# Patient Record
Sex: Female | Born: 1980 | Race: White | Hispanic: No | Marital: Single | Smoking: Current every day smoker
Health system: Southern US, Community
[De-identification: ages and names within clinical notes are randomized; demographics above are authoritative.]

---

## 2016-03-16 ENCOUNTER — Encounter (HOSPITAL_COMMUNITY): Payer: Self-pay | Admitting: Emergency Medicine

## 2016-03-16 ENCOUNTER — Emergency Department (HOSPITAL_COMMUNITY)
Admission: EM | Admit: 2016-03-16 | Discharge: 2016-03-16 | Disposition: A | Discharge status: Home / Self Care | Payer: Medicaid Other | Attending: Emergency Medicine | Admitting: Emergency Medicine

## 2016-03-16 DIAGNOSIS — F17200 Nicotine dependence, unspecified, uncomplicated: Secondary | ICD-10-CM | POA: Insufficient documentation

## 2016-03-16 DIAGNOSIS — R0981 Nasal congestion: Secondary | ICD-10-CM | POA: Diagnosis present

## 2016-03-16 MED ORDER — AZITHROMYCIN 250 MG PO TABS
500.0000 mg | ORAL_TABLET | Freq: Once | ORAL | Status: AC
  Administered 2016-03-16: 500 mg via ORAL
  Filled 2016-03-16: qty 2

## 2016-03-16 MED ORDER — TRIAMCINOLONE ACETONIDE 55 MCG/ACT NA AERO: 2.0000 | INHALATION_SPRAY | Freq: Every day | NASAL | 12 refills | Status: AC

## 2016-03-16 MED ORDER — AZITHROMYCIN 250 MG PO TABS: 250.0000 mg | ORAL_TABLET | Freq: Every day | ORAL | 0 refills | Status: AC

## 2016-03-16 NOTE — Discharge Instructions (Signed)
Stay well-hydrated. Use nasal spray as instructed. Take entire course of antibiotic even if you feel better.

## 2016-03-16 NOTE — ED Notes (Signed)
Patient states she has had head cold that started Monday night, sinus congestion and h/a. yest patient states had fevers. Now c/o green drainage from nose bothering her but denies cough or any resp. symptoms.

## 2016-03-16 NOTE — ED Triage Notes (Signed)
Pt sts URI sx with nasal congestion x 3 days

## 2016-03-16 NOTE — ED Provider Notes (Signed)
[facility]-EMERGENCY DEPT Provider Note   CSN: [id number] Arrival date & time: 03/16/16  1604  By signing my name below, I, Teofilo Pod, attest that this documentation has been prepared under the direction and in the presence of Mathews Robinsons, [place]. Electronically Signed: Teofilo Pod, ED Scribe. 03/16/2016. 5:38 PM.    History   Chief Complaint Chief Complaint  Patient presents with  . URI    The history is provided by the patient. No language interpreter was used.   HPI Comments:  Brooke Becker is a 36 y.o. female who presents to the Emergency Department complaining of constant nasal congestion x 3 days. Pt complains of associated rhinorrhea, general weakness, fever, fatigue, sinus pain, cough, and generalized body aches that have since improved. She states the her rhinorrhea has been green, and states that the mucus is causing her nose to feel iritated. Pt reports that she had similar symptoms 3 months ago with vomiting. Pt took OTC flu medication, mucinex, and motrin, with no relief. Pt denies sore throat  History reviewed. No pertinent past medical history.  There are no active problems to display for this patient.   History reviewed. No pertinent surgical history.  OB History    No data available       Home Medications    Prior to Admission medications   Medication Sig Start Date End Date Taking? Authorizing Provider  azithromycin (ZITHROMAX Z-PAK) 250 MG tablet Take 1 tablet (250 mg total) by mouth daily. 03/16/16 03/20/16  Georgiana Shore, PA-C  triamcinolone (NASACORT AQ) 55 MCG/ACT AERO nasal inhaler Place 2 sprays into the nose daily. 03/16/16   Georgiana Shore, PA-C    Family History History reviewed. No pertinent family history.  Social History Social History  Substance Use Topics  . Smoking status: Current Every Day Smoker  . Smokeless tobacco: Never Used  . Alcohol use Yes     Allergies   Patient has no known allergies.   Review of  Systems Review of Systems  Constitutional: Positive for fatigue and fever. Negative for chills.       Subjective fevers   HENT: Positive for congestion and sinus pain. Negative for ear pain and sore throat.   Eyes: Negative for pain and visual disturbance.  Respiratory: Positive for cough. Negative for chest tightness, shortness of breath, wheezing and stridor.   Cardiovascular: Negative for chest pain and palpitations.  Gastrointestinal: Negative for abdominal distention, abdominal pain, blood in stool, nausea and vomiting.  Genitourinary: Negative for difficulty urinating, dysuria, flank pain and hematuria.  Musculoskeletal: Positive for myalgias. Negative for arthralgias, back pain, gait problem, joint swelling, neck pain and neck stiffness.  Skin: Negative for color change, pallor and rash.  Neurological: Negative for dizziness, seizures, syncope and weakness.     Physical Exam Updated Vital Signs BP 132/64 (BP Location: Right Arm)   Pulse 105   Temp 98.2 F (36.8 C) (Oral)   Resp 18   SpO2 100%   Physical Exam  Constitutional: She appears well-developed and well-nourished. No distress.  Patient is afebrile, non-toxic appearing, seating comfortably in chair in no acute distress.   HENT:  Head: Normocephalic and atraumatic.  Mouth/Throat: Oropharynx is clear and moist. No oropharyngeal exudate.  Boggy turbinates on the left side. Frontal sinus tenderness.  Eyes: Conjunctivae and EOM are normal. Pupils are equal, round, and reactive to light. Right eye exhibits no discharge. Left eye exhibits no discharge.  Neck: Normal range of motion. Neck supple.  Cardiovascular:  Normal rate, regular rhythm and normal heart sounds.   Pulmonary/Chest: Effort normal and breath sounds normal. No respiratory distress. She has no wheezes. She has no rales. She exhibits no tenderness.  Abdominal: She exhibits no distension.  Musculoskeletal: She exhibits no edema.  Neurological: She is alert.    Skin: Skin is warm and dry. No rash noted. She is not diaphoretic. No erythema. No pallor.  Psychiatric: She has a normal mood and affect. Her behavior is normal.  Nursing note and vitals reviewed.    ED Treatments / Results  DIAGNOSTIC STUDIES:  Oxygen Saturation is 100% on RA, normal by my interpretation.    COORDINATION OF CARE:  5:32 PM Discussed treatment plan with pt at bedside and pt agreed to plan.    Labs (all labs ordered are listed, but only abnormal results are displayed) Labs Reviewed - No data to display  EKG  EKG Interpretation None       Radiology No results found.  Procedures Procedures (including critical care time)  Medications Ordered in ED Medications  azithromycin (ZITHROMAX) tablet 500 mg (500 mg Oral Given 03/16/16 1800)     Initial Impression / Assessment and Plan / ED Course   I have reviewed the triage vital signs and the nursing notes.  Pertinent labs & imaging results that were available during my care of the patient were reviewed by me and considered in my medical decision making (see chart for details).    Pt symptoms consistent with URI. Pt will be discharged with symptomatic treatment.  Discussed return precautions.  Pt is hemodynamically stable & in NAD prior to discharge.  Some frontal sinus tenderness to percussion, swollen turbinate. Exam otherwise unremarkable. Patient is afebrile and non-toxic appearing without use of antipyretics today.  Patient was discussed with Dr. Jeraldine LootsLockwood who recommends z-pak and agrees with assessment and plan.  Discussed strict return precautions. Patient was advised to return to the emergency department if experiencing any new or worsening symptoms. Patient clearly understood instructions and agreed with discharge plan.  Final Clinical Impressions(s) / ED Diagnoses   Final diagnoses:  Nasal sinus congestion    New Prescriptions Discharge Medication List as of 03/16/2016  5:53 PM    START  taking these medications   Details  azithromycin (ZITHROMAX Z-PAK) 250 MG tablet Take 1 tablet (250 mg total) by mouth daily., Starting Wed 03/16/2016, Until Sun 03/20/2016, Print    triamcinolone (NASACORT AQ) 55 MCG/ACT AERO nasal inhaler Place 2 sprays into the nose daily., Starting Wed 03/16/2016, Print      I personally performed the services described in this documentation, which was scribed in my presence. The recorded information has been reviewed and is accurate.     Georgiana ShoreJessica B Zanaria Morell, PA-C 03/16/16 1849    Gerhard Munchobert Lockwood, MD 03/16/16 2041

## 2016-10-25 ENCOUNTER — Encounter: Payer: Self-pay | Admitting: Obstetrics & Gynecology

## 2016-11-11 ENCOUNTER — Other Ambulatory Visit (HOSPITAL_COMMUNITY)
Admission: RE | Admit: 2016-11-11 | Discharge: 2016-11-11 | Disposition: A | Discharge status: Home / Self Care | Payer: Medicaid Other | Source: Ambulatory Visit | Attending: Obstetrics & Gynecology | Admitting: Obstetrics & Gynecology

## 2016-11-11 ENCOUNTER — Ambulatory Visit (INDEPENDENT_AMBULATORY_CARE_PROVIDER_SITE_OTHER): Payer: Medicaid Other | Admitting: Obstetrics & Gynecology

## 2016-11-11 ENCOUNTER — Other Ambulatory Visit: Payer: Self-pay | Admitting: Obstetrics & Gynecology

## 2016-11-11 VITALS — BP 114/72 | HR 72 | Wt 235.0 lb

## 2016-11-11 DIAGNOSIS — Z01419 Encounter for gynecological examination (general) (routine) without abnormal findings: Secondary | ICD-10-CM

## 2016-11-11 DIAGNOSIS — Z0000 Encounter for general adult medical examination without abnormal findings: Secondary | ICD-10-CM

## 2016-11-11 NOTE — Progress Notes (Signed)
Subjective:    Brooke Becker is a 36 y.o. separated (for 13 years) White female P3 (5916, 10615, and 38 yo sons)  who presents for an annual exam. She has LLB pain for about 3 months, worried that this could be her only remaining ovary. The patient is sexually active. GYN screening history: last pap: was normal. The patient wears seatbelts: yes. The patient participates in regular exercise: yes. Has the patient ever been transfused or tattooed?: yes. The patient reports that there is not domestic violence in her life.   Menstrual History: OB History    No data available      Menarche age: [age] No LMP recorded.    The following portions of the patient's history were reviewed and updated as appropriate: allergies, current medications, past family history, past medical history, past social history, past surgical history and problem list.  Review of Systems Pertinent items are noted in HPI.   FH- + breast cancer- pGM at age 36, p first cousin at 721 yo + ovarian cancer- maunt, mfirst cousin ? Colon cancer Monogamous for 5 years with a fellow, denies dyspareunia, had a BTL Works as a Secondary school teacherhome hospice aide Had a flu vaccine this year Had a mammogram 2012   Objective:    BP 114/72   Pulse 72   Wt 235 lb (106.6 kg)   General Appearance:    Alert, cooperative, no distress, appears stated age  Head:    Normocephalic, without obvious abnormality, atraumatic  Eyes:    PERRL, conjunctiva/corneas clear, EOM's intact, fundi    benign, both eyes  Ears:    Normal TM's and external ear canals, both ears  Nose:   Nares normal, septum midline, mucosa normal, no drainage    or sinus tenderness  Throat:   Lips, mucosa, and tongue normal; teeth and gums normal  Neck:   Supple, symmetrical, trachea midline, no adenopathy;    thyroid:  no enlargement/tenderness/nodules; no carotid   bruit or JVD  Back:     Symmetric, no curvature, ROM normal, no CVA tenderness  Lungs:     Clear to auscultation bilaterally,  respirations unlabored  Chest Wall:    No tenderness or deformity   Heart:    Regular rate and rhythm, S1 and S2 normal, no murmur, rub   or gallop  Breast Exam:    No tenderness, masses, or nipple abnormality  Abdomen:     Soft, non-tender, bowel sounds active all four quadrants,    no masses, no organomegaly  Genitalia:    Normal female without lesion, discharge or tenderness, NSSA, NT, mobile, normal adnexal exam     Extremities:   Extremities normal, atraumatic, no cyanosis or edema  Pulses:   2+ and symmetric all extremities  Skin:   Skin color, texture, turgor normal, no rashes or lesions  Lymph nodes:   Cervical, supraclavicular, and axillary nodes normal  Neurologic:   CNII-XII intact, normal strength, sensation and reflexes    throughout  .    Assessment:    Healthy female exam.    Plan:     Thin prep Pap smear. with cotesting My Risk testing Rec mammogram Gyn u/s

## 2016-11-11 NOTE — Progress Notes (Signed)
BACK PAIN  

## 2016-11-15 LAB — CYTOLOGY - PAP
Diagnosis: NEGATIVE
HPV (WINDOPATH): NOT DETECTED

## 2016-11-16 ENCOUNTER — Other Ambulatory Visit: Payer: Self-pay | Admitting: Obstetrics & Gynecology

## 2016-11-23 ENCOUNTER — Ambulatory Visit (HOSPITAL_COMMUNITY)
Admission: RE | Admit: 2016-11-23 | Discharge: 2016-11-23 | Disposition: A | Discharge status: Home / Self Care | Payer: Medicaid Other | Source: Ambulatory Visit | Attending: Obstetrics & Gynecology | Admitting: Obstetrics & Gynecology

## 2016-11-23 DIAGNOSIS — Z01419 Encounter for gynecological examination (general) (routine) without abnormal findings: Secondary | ICD-10-CM | POA: Diagnosis not present

## 2016-11-23 DIAGNOSIS — R102 Pelvic and perineal pain: Secondary | ICD-10-CM | POA: Diagnosis present

## 2016-11-29 ENCOUNTER — Encounter: Payer: Self-pay | Admitting: *Deleted

## 2017-05-31 ENCOUNTER — Telehealth: Payer: Self-pay | Admitting: Radiology

## 2017-05-31 ENCOUNTER — Other Ambulatory Visit: Payer: Self-pay | Admitting: Obstetrics & Gynecology

## 2017-05-31 DIAGNOSIS — Z1231 Encounter for screening mammogram for malignant neoplasm of breast: Secondary | ICD-10-CM

## 2017-05-31 NOTE — Telephone Encounter (Signed)
Patient had u/s in 11/2016 and would like to know results. Please advised

## 2017-06-01 ENCOUNTER — Telehealth: Payer: Self-pay | Admitting: Radiology

## 2017-06-01 NOTE — Telephone Encounter (Signed)
Left message for patient to call [facility] to schedule appointment to come in a discuss results from [place] with Dr Marice Potter

## 2017-06-19 ENCOUNTER — Ambulatory Visit: Payer: Medicaid Other | Admitting: Obstetrics & Gynecology

## 2017-06-20 ENCOUNTER — Ambulatory Visit: Payer: Medicaid Other

## 2017-07-07 ENCOUNTER — Ambulatory Visit: Payer: Medicaid Other

## 2017-09-14 ENCOUNTER — Encounter: Payer: Self-pay | Admitting: Radiology

## 2017-11-21 ENCOUNTER — Telehealth: Payer: Self-pay | Admitting: Radiology

## 2017-11-21 NOTE — Telephone Encounter (Signed)
Left message to call [facility] to reschedule 12/04/17 with Dr Marice Potter if needed.

## 2017-12-04 ENCOUNTER — Ambulatory Visit: Payer: Medicaid Other | Admitting: Nurse Practitioner

## 2018-09-27 IMAGING — US US TRANSVAGINAL NON-OB
1 series · 15 of 25 positions shown · non-contrast
Comparison: None.

CLINICAL DATA: Left pelvic pain x3 months

EXAM:
ULTRASOUND PELVIS TRANSVAGINAL
TECHNIQUE: Transvaginal ultrasound examination of the pelvis was performed
including evaluation of the uterus, ovaries, adnexal regions, and
pelvic cul-de-sac.

[Series 1: us transvaginal non-ob · 45 acquisitions, 15 frames shown]
[im 1/45]
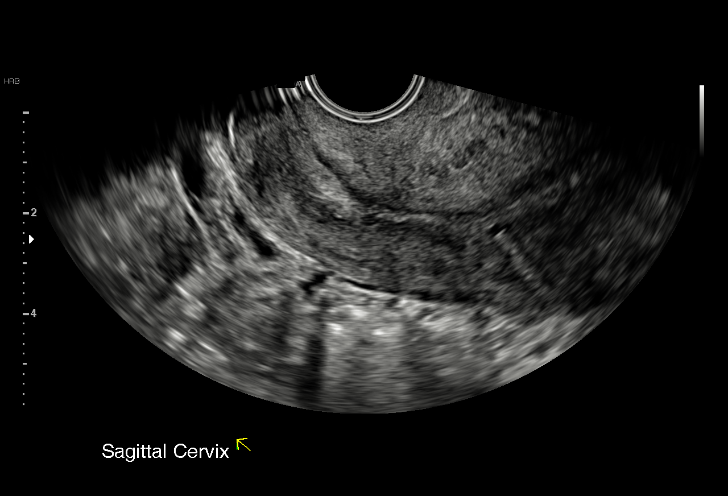
[im 4/45]
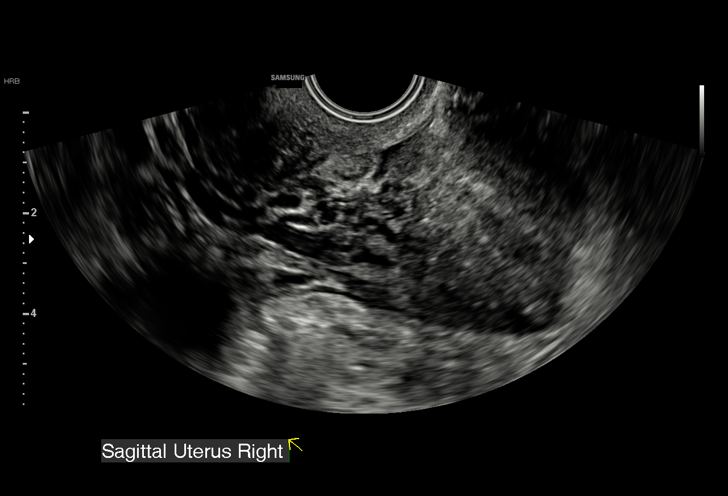
[im 8/45]
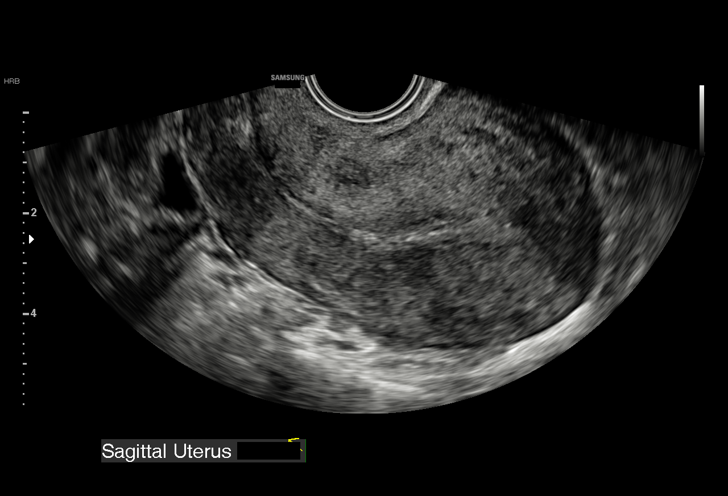
[im 10/45]
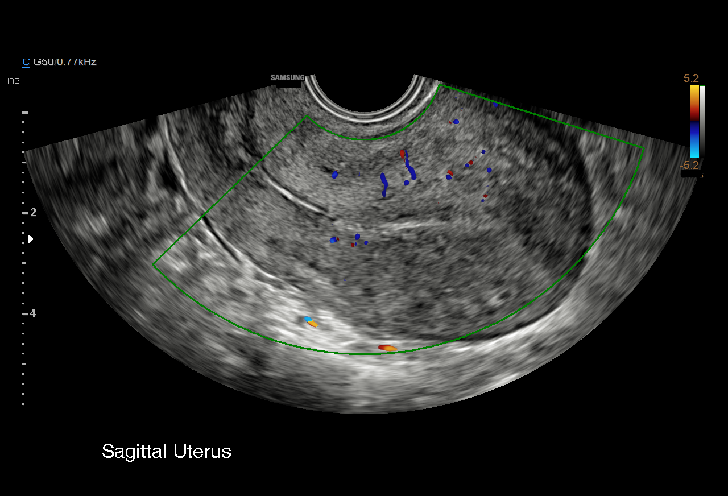
[im 13/45]
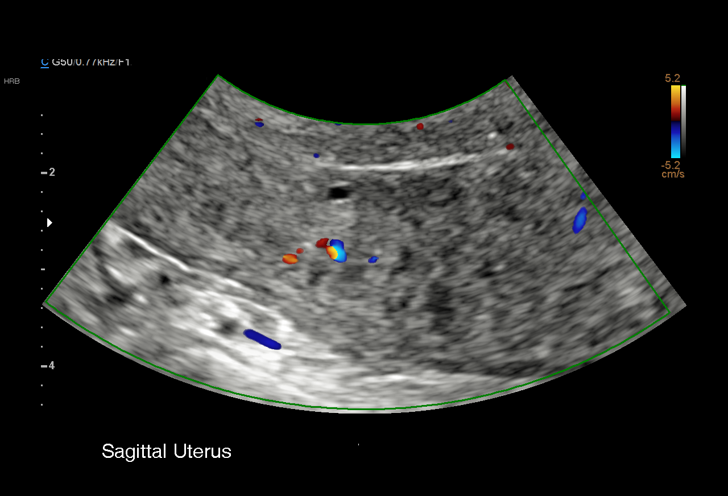
[im 17/45]
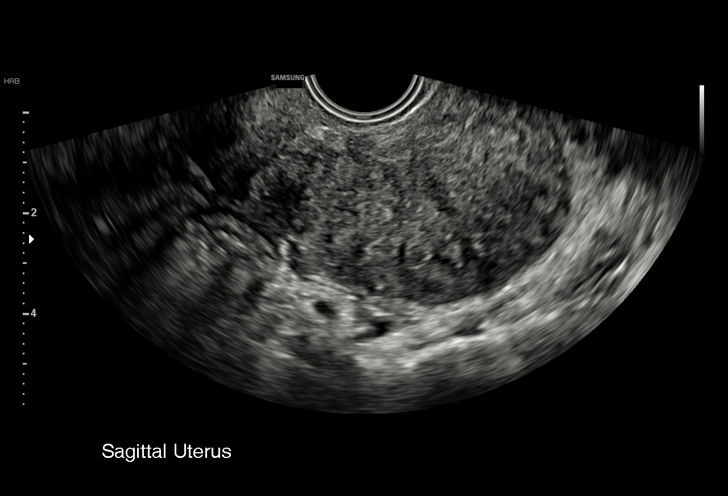
[im 19/45]
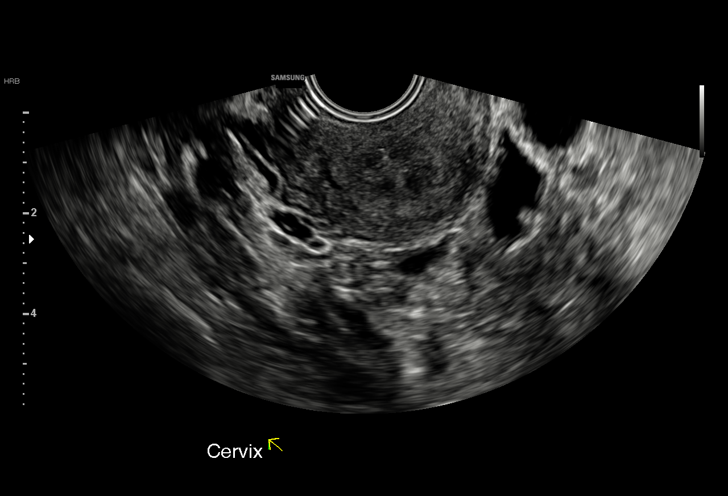
[im 23/45]
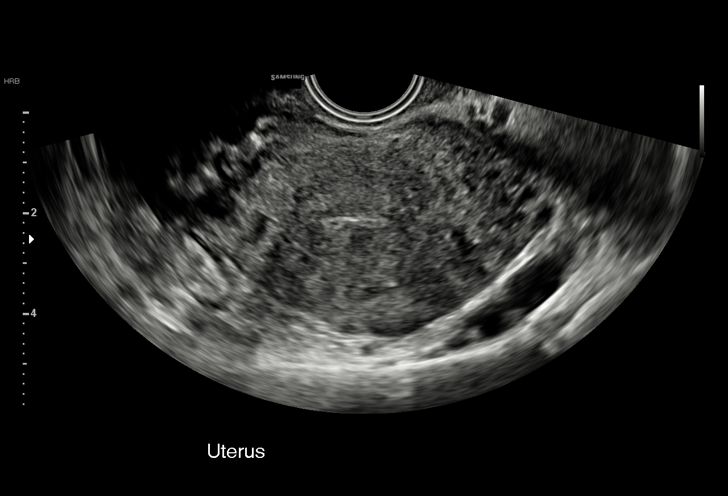
[im 26/45]
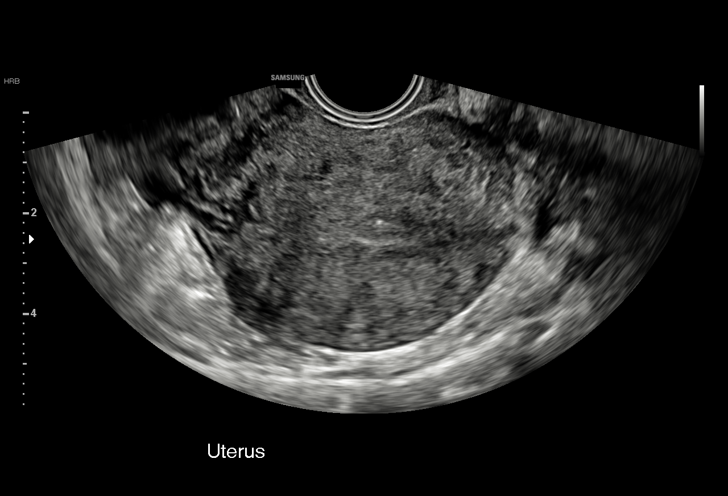
[im 28/45]
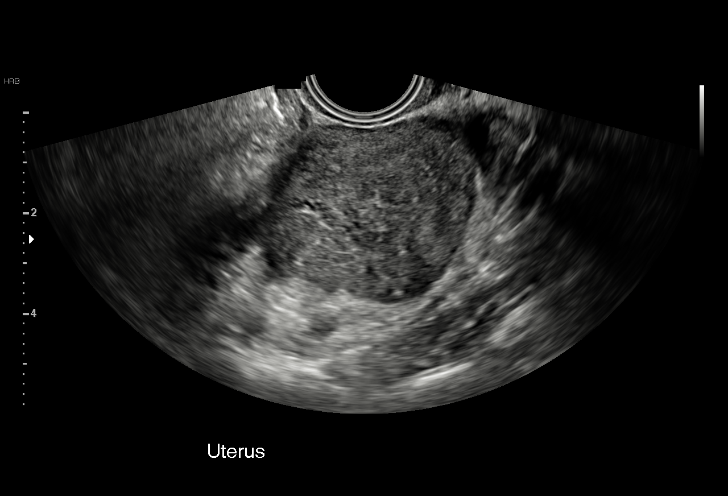
[im 32/45]
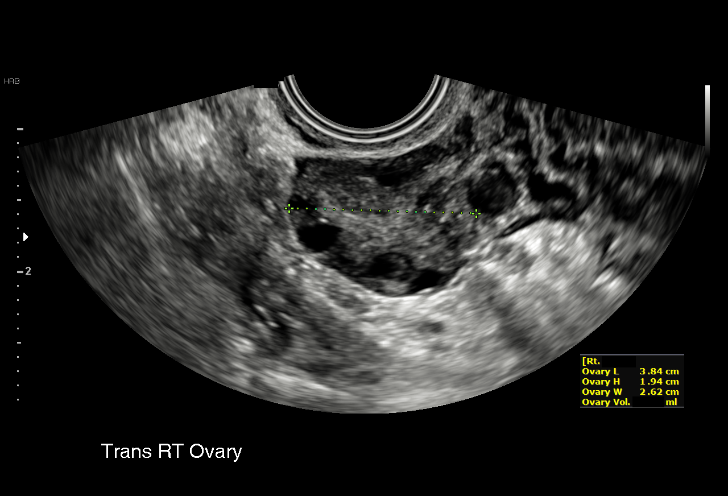
[im 35/45]
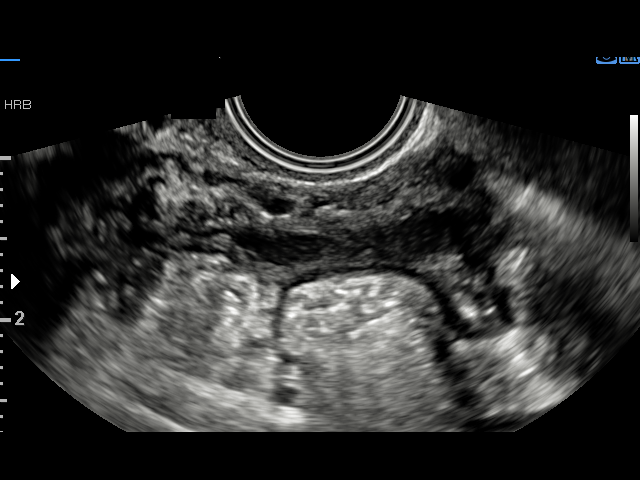
[im 37/45]
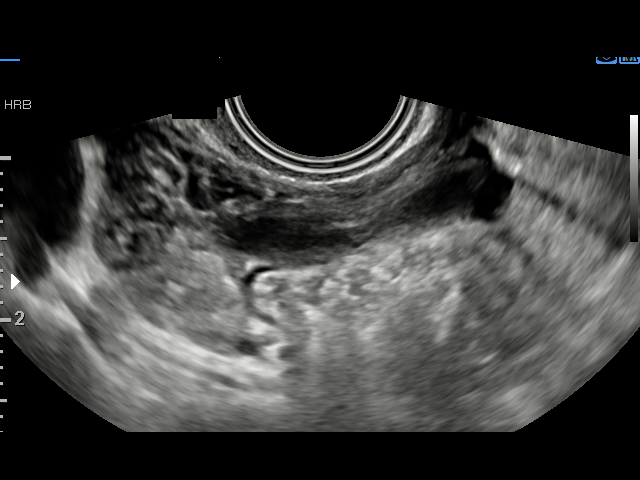
[im 41/45]
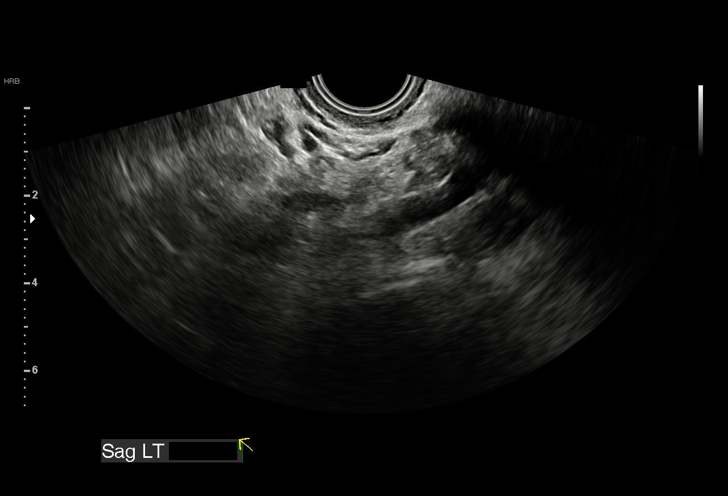
[im 45/45]
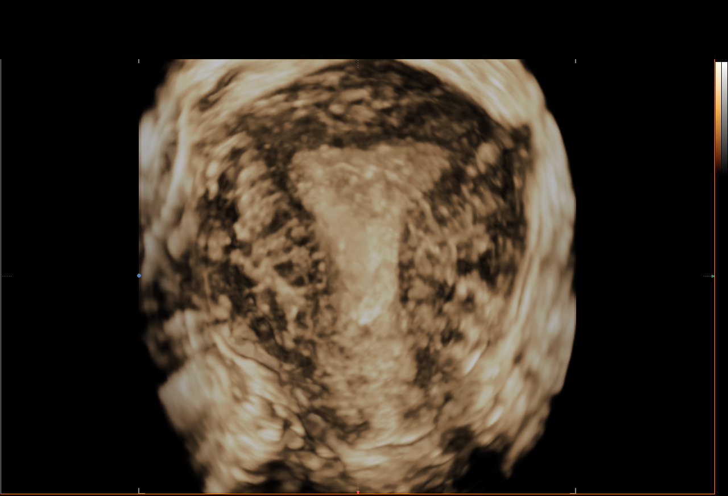

[15 of 25 positions shown; findings below may reference images not displayed]

FINDINGS: Uterus

Measurements: 9.6 x 5.4 x 6.7 cm. Retroverted. No fibroids or other
mass visualized.

Endometrium

Thickness: 8 mm.  No focal abnormality visualized.

Right ovary

Measurements: 3.8 x 1.0 x 2.6 cm. Adjacent tubular structure may
reflect hydrosalpinx.

Left ovary

Not discretely visualized.  No adnexal mass is seen.

Other findings:  No abnormal free fluid
IMPRESSION: Suspected right hydrosalpinx.

Left ovary is not discretely visualized.  No adnexal mass is seen.

## 2019-04-13 ENCOUNTER — Ambulatory Visit: Payer: Self-pay | Attending: Internal Medicine

## 2019-04-15 ENCOUNTER — Ambulatory Visit: Payer: Medicaid Other | Attending: Internal Medicine

## 2019-04-15 DIAGNOSIS — Z23 Encounter for immunization: Secondary | ICD-10-CM

## 2019-04-15 NOTE — Progress Notes (Signed)
   Covid-19 Vaccination Clinic  Name:  Brooke Becker    MRN: [id number] DOB: 04-24-1980  04/15/2019  [name] was observed post Covid-19 immunization for 15 minutes without incident. She was provided with Vaccine Information Sheet and instruction to access the V-Safe system.   [name] was instructed to call 911 with any severe reactions post vaccine: Marland Kitchen Difficulty breathing  . Swelling of face and throat  . A fast heartbeat  . A bad rash all over body  . Dizziness and weakness   Immunizations Administered    Name Date Dose VIS Date Route   Pfizer COVID-19 Vaccine 04/15/2019  1:40 PM 0.3 mL 12/28/2018 Intramuscular   Manufacturer: [employer], [employer]   Lot: [id number]   NDC: [address]-8

## 2019-05-07 ENCOUNTER — Ambulatory Visit: Payer: Medicaid Other

## 2019-05-09 ENCOUNTER — Ambulatory Visit: Payer: Medicaid Other | Attending: Internal Medicine

## 2019-05-09 ENCOUNTER — Ambulatory Visit: Payer: Medicaid Other

## 2019-05-09 DIAGNOSIS — Z23 Encounter for immunization: Secondary | ICD-10-CM

## 2019-05-09 NOTE — Progress Notes (Signed)
   Covid-19 Vaccination Clinic  Name:  Brooke Becker    MRN: [id number] DOB: 1980-09-06  05/09/2019  Ms. Brooke Becker was observed post Covid-19 immunization for 15 minutes without incident. She was provided with Vaccine Information Sheet and instruction to access the V-Safe system.   Ms. Brooke Becker was instructed to call 911 with any severe reactions post vaccine: Marland Kitchen Difficulty breathing  . Swelling of face and throat  . A fast heartbeat  . A bad rash all over body  . Dizziness and weakness   Immunizations Administered    Name Date Dose VIS Date Route   Pfizer COVID-19 Vaccine 05/09/2019  3:39 PM 0.3 mL 03/13/2018 Intramuscular   Manufacturer: [employer], [employer]   Lot: [id number]   NDC: [address]-9

## 2021-02-04 ENCOUNTER — Ambulatory Visit: Payer: Medicaid Other

## 2023-07-24 ENCOUNTER — Ambulatory Visit: Admitting: Nurse Practitioner
